# Patient Record
Sex: Female | Born: 1960 | Race: White | Hispanic: No | Marital: Married | Smoking: Former smoker
Health system: Southern US, Community
[De-identification: ages and names within clinical notes are randomized; demographics above are authoritative.]

## PROBLEM LIST (undated history)

## (undated) DIAGNOSIS — I10 Essential (primary) hypertension: Secondary | ICD-10-CM

## (undated) HISTORY — PX: EYE SURGERY: SHX253

## (undated) HISTORY — DX: Essential (primary) hypertension: I10

---

## 2002-06-01 ENCOUNTER — Other Ambulatory Visit: Admission: RE | Admit: 2002-06-01 | Discharge: 2002-06-01 | Payer: Self-pay | Admitting: Unknown Physician Specialty

## 2017-12-19 ENCOUNTER — Emergency Department (HOSPITAL_COMMUNITY)
Admission: EM | Admit: 2017-12-19 | Discharge: 2017-12-19 | Disposition: A | Discharge status: Home / Self Care | Payer: Managed Care, Other (non HMO) | Attending: Emergency Medicine | Admitting: Emergency Medicine

## 2017-12-19 ENCOUNTER — Emergency Department (HOSPITAL_COMMUNITY): Payer: Managed Care, Other (non HMO)

## 2017-12-19 ENCOUNTER — Encounter (HOSPITAL_COMMUNITY): Payer: Self-pay

## 2017-12-19 ENCOUNTER — Other Ambulatory Visit: Payer: Self-pay

## 2017-12-19 DIAGNOSIS — Z79899 Other long term (current) drug therapy: Secondary | ICD-10-CM | POA: Diagnosis not present

## 2017-12-19 DIAGNOSIS — I10 Essential (primary) hypertension: Secondary | ICD-10-CM | POA: Diagnosis not present

## 2017-12-19 DIAGNOSIS — Z87891 Personal history of nicotine dependence: Secondary | ICD-10-CM | POA: Diagnosis not present

## 2017-12-19 DIAGNOSIS — R0789 Other chest pain: Secondary | ICD-10-CM | POA: Insufficient documentation

## 2017-12-19 DIAGNOSIS — R079 Chest pain, unspecified: Secondary | ICD-10-CM

## 2017-12-19 LAB — BASIC METABOLIC PANEL
Anion gap: 9 (ref 5–15)
BUN: 10 mg/dL (ref 6–20)
CALCIUM: 9.5 mg/dL (ref 8.9–10.3)
CO2: 26 mmol/L (ref 22–32)
Chloride: 107 mmol/L (ref 101–111)
Creatinine, Ser: 0.85 mg/dL (ref 0.44–1.00)
GFR calc Af Amer: >60 mL/min (ref >60)
Glucose, Bld: 106 mg/dL — ABNORMAL HIGH (ref 65–99)
Potassium: 4 mmol/L (ref 3.5–5.1)
SODIUM: 142 mmol/L (ref 135–145)

## 2017-12-19 LAB — CBC
HCT: 43.8 % (ref 36.0–46.0)
Hemoglobin: 14.6 g/dL (ref 12.0–15.0)
MCH: 29.1 pg (ref 26.0–34.0)
MCHC: 33.3 g/dL (ref 30.0–36.0)
MCV: 87.3 fL (ref 78.0–100.0)
Platelets: 288 10*3/uL (ref 150–400)
RBC: 5.02 MIL/uL (ref 3.87–5.11)
RDW: 12.3 % (ref 11.5–15.5)
WBC: 6.8 10*3/uL (ref 4.0–10.5)

## 2017-12-19 LAB — I-STAT TROPONIN, ED
Troponin i, poc: 0 ng/mL (ref 0.00–0.08)
Troponin i, poc: 0.07 ng/mL (ref 0.00–0.08)

## 2017-12-19 LAB — I-STAT BETA HCG BLOOD, ED (MC, WL, AP ONLY)

## 2017-12-19 MED ORDER — PANTOPRAZOLE SODIUM 20 MG PO TBEC: 20.0000 mg | DELAYED_RELEASE_TABLET | Freq: Every day | ORAL | 0 refills | Status: DC

## 2017-12-19 NOTE — ED Provider Notes (Signed)
[facility] EMERGENCY DEPARTMENT Provider Note   CSN: [id number] Arrival date & time: 12/19/17  1332     History   Chief Complaint Chief Complaint  Patient presents with  . Chest Pain    HPI Zane Samson Pinedo is a 57 y.o. female.  Patient is a 57 year old female who presents with chest pain.  She has no known past medical history.  She states she has been under a lot of stress recently taking care of her elderly parents.  She states over the last 1-2 weeks she has had a discomfort in the center of her chest.  She describes as a squeezing pain.  It does seem to be somewhat related to eating but is not always related to eating.  There is no radiation of the pain.  No associated shortness of breath, nausea or diaphoresis.  No history of cardiac events in the past.  There is no exertional symptoms.  She has no family history of early heart disease.  She is a non-smoker.  No known history of hypertension or hyperlipidemia although her husband states that her blood pressure has been a little bit higher this week than it normally is.  She is noted to have an elevated blood pressure here in the emergency room but does appear to be anxious.  She was initially seen in an urgent care in [facility] and sent here for further evaluation due to an abnormal EKG.  She currently denies any chest pain.      History reviewed. No pertinent past medical history.  There are no active problems to display for this patient.   Past Surgical History:  Procedure Laterality Date  . EYE SURGERY      OB History    No data available       Home Medications    Prior to Admission medications   Medication Sig Start Date End Date Taking? Authorizing Provider  doxycycline (VIBRAMYCIN) 100 MG capsule Take 100 mg by mouth 2 (two) times daily. 12/06/17  Yes [provider]  gentamicin ointment (GARAMYCIN) 0.1 % Apply 1 application topically 2 (two) times daily. 12/07/17  Yes  [provider]  ibuprofen (ADVIL,MOTRIN) 800 MG tablet Take 800 mg by mouth every 8 (eight) hours as needed for moderate pain.   Yes [provider]  pantoprazole (PROTONIX) 20 MG tablet Take 1 tablet (20 mg total) by mouth daily. 12/19/17   Rolan Bucco, MD    Family History No family history on file.  Social History Social History   Tobacco Use  . Smoking status: Former Games developer  . Smokeless tobacco: Never Used  Substance Use Topics  . Alcohol use: Yes    Comment: Occasionally  . Drug use: No     Allergies   Asa [aspirin]; Fish allergy; Niacin and related; and Omega 3 [fish oil]   Review of Systems Review of Systems  Constitutional: Negative for chills, diaphoresis, fatigue and fever.  HENT: Negative for congestion, rhinorrhea and sneezing.   Eyes: Negative.   Respiratory: Positive for chest tightness. Negative for cough and shortness of breath.   Cardiovascular: Negative for chest pain and leg swelling.  Gastrointestinal: Negative for abdominal pain, blood in stool, diarrhea, nausea and vomiting.  Genitourinary: Negative for difficulty urinating, flank pain, frequency and hematuria.  Musculoskeletal: Negative for arthralgias and back pain.  Skin: Negative for rash.  Neurological: Negative for dizziness, speech difficulty, weakness, numbness and headaches.     Physical Exam Updated Vital Signs BP Marland Kitchen)  179/87   Pulse 66   Temp 98.2 F (36.8 C) (Oral)   Resp (!) 21   Ht 5\' 6"  (1.676 m)   Wt 88 kg (194 lb)   SpO2 96%   BMI 31.31 kg/m   Physical Exam  Constitutional: She is oriented to person, place, and time. She appears well-developed and well-nourished.  HENT:  Head: Normocephalic and atraumatic.  Eyes: Pupils are equal, round, and reactive to light.  Neck: Normal range of motion. Neck supple.  Cardiovascular: Normal rate, regular rhythm and normal heart sounds.  Pulmonary/Chest: Effort normal and breath sounds normal. No respiratory  distress. She has no wheezes. She has no rales. She exhibits no tenderness.  Abdominal: Soft. Bowel sounds are normal. There is no tenderness. There is no rebound and no guarding.  Musculoskeletal: Normal range of motion. She exhibits no edema.  No edema or calf tenderness  Lymphadenopathy:    She has no cervical adenopathy.  Neurological: She is alert and oriented to person, place, and time.  Skin: Skin is warm and dry. No rash noted.  Psychiatric: She has a normal mood and affect.     ED Treatments / Results  Labs (all labs ordered are listed, but only abnormal results are displayed) Labs Reviewed  BASIC METABOLIC PANEL - Abnormal; Notable for the following components:      Result Value   Glucose, Bld 106 (*)    All other components within normal limits  CBC  I-STAT TROPONIN, ED  I-STAT BETA HCG BLOOD, ED ([facility], [facility], [facility] ONLY)  I-STAT TROPONIN, ED    EKG  EKG Interpretation  Date/Time:  Thursday December 19 2017 21:04:03 EST Ventricular Rate:  71 PR Interval:  162 QRS Duration: 99 QT Interval:  425 QTC Calculation: 462 R Axis:   60 Text Interpretation:  Sinus rhythm since last tracing no significant change Confirmed by Rolan BuccoBelfi, Ekin Pilar [phone]) on 12/19/2017 9:07:06 PM       Radiology Dg Chest 2 View  Result Date: 12/19/2017 CLINICAL DATA:  Chest tightness and dizziness for 3 days. EXAM: CHEST  2 VIEW COMPARISON:  None. FINDINGS: The lungs are clear. Heart size is upper normal. No pneumothorax or pleural effusion. No bony abnormality. IMPRESSION: No acute disease. Electronically Signed   By: Drusilla Kannerhomas  Dalessio M.D.   On: 12/19/2017 14:58    Procedures Procedures (including critical care time)  Medications Ordered in ED Medications - No data to display   Initial Impression / Assessment and Plan / ED Course  I have reviewed the triage vital signs and the nursing notes.  Pertinent labs & imaging results that were available during my care of the patient were reviewed by me  and considered in my medical decision making (see chart for details).     Patient presents with chest pain.  She describes it is a tightness in the center of her chest.  It is nonradiating.  There is no exertional symptoms.  There is no ischemic changes noted on EKG.  Her only abnormality is a T wave inversion in lead II.  I reviewed the EKG from the urgent care which showed a Q wave and inverted T wave in lead II but no other abnormalities.  She has had 2- troponins.  She is remained chest pain-free.  She has had a repeat EKG which is also is unchanged.  She has a heart score of 2.  Given this, I feel that she can be discharged home with close follow-up with her PCP.  I will start her on Protonix as it sounds like it may be GERD related.  I did encourage her to have close follow-up with her PCP tomorrow.  There is also a cardiologist in her area from [facility].  It was discussed that the patient can follow-up with her PCP tomorrow and either have a stress test arranged by them or have close follow-up with a cardiologist.  Return precautions were given.  Final Clinical Impressions(s) / ED Diagnoses   Final diagnoses:  Nonspecific chest pain    ED Discharge Orders        Ordered    pantoprazole (PROTONIX) 20 MG tablet  Daily     12/19/17 2107       Rolan Bucco, MD 12/19/17 2117

## 2017-12-19 NOTE — ED Triage Notes (Signed)
Per Pt, Pt is coming from UC where she was evaluated for "not feeling well" including chest tightness, weakness, lightheadedness.  HTN. Denies SOB, N/V/D. They reported some abnormal EKG changes.

## 2017-12-19 NOTE — ED Notes (Signed)
Patient verbalizes understanding of discharge instructions. Opportunity for questioning and answers were provided. Armband removed by staff, pt discharged from ED ambulatory.   

## 2018-01-07 ENCOUNTER — Encounter: Payer: Self-pay | Admitting: *Deleted

## 2018-01-07 ENCOUNTER — Encounter: Payer: Self-pay | Admitting: Cardiology

## 2018-01-07 NOTE — Progress Notes (Signed)
Cardiology Office Note  Date: 01/08/2018   ID: Susan Frank, DOB 1961-01-31, MRN [id number]  PCP: Estanislado Pandy, MD  Consulting Cardiologist: Nona Dell, MD   Chief Complaint  Patient presents with  . Chest Pain    History of Present Illness: Susan Frank is a 57 y.o. female referred for cardiology consultation by Ms. Nechama Guard for the assessment of chest pain. Record review finds ER visit on January 3 with chest discomfort, ECG nonspecific with poor R-wave progression, troponin I negative. She was started on Protonix for possibility of reflux. She states that that was the most intense, sharp episode that she has experienced, occurred at rest and scared her. She states that she has been under a lot of emotional stress. 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 She also indicates that her blood pressure has been fluctuating, not optimally controlled. She had her lisinopril dose increased recently by PCP. Since being on Protonix, she has had improvement in chest pain, only a few brief fleeting episodes.  Cardiac risk factors include hypertension, also she has a family history of diabetes mellitus and heart disease in first-degree relatives. She reports compliance with her medications which are outlined below.  She has not undergone any previous ischemic cardiac testing.  Past Medical History:  Diagnosis Date  . Essential hypertension     Past Surgical History:  Procedure Laterality Date  . EYE SURGERY      Current Outpatient Medications  Medication Sig Dispense Refill  . ibuprofen (ADVIL,MOTRIN) 800 MG tablet Take 800 mg by mouth every 8 (eight) hours as needed for moderate pain.    Marland Kitchen lisinopril (PRINIVIL,ZESTRIL) 10 MG tablet Take 20 mg by mouth daily.     . pantoprazole (PROTONIX) 20 MG tablet Take 1 tablet (20 mg total) by mouth daily. 30 tablet 0   No current  facility-administered medications for this visit.    Allergies:  Asa [aspirin]; Zoloft [sertraline hcl]; Fish allergy; Niacin and related; and Omega 3 [fish oil]   Social History: The patient  reports that she has quit smoking. she has never used smokeless tobacco. She reports that she drinks alcohol. She reports that she does not use drugs.   Family History: The patient's family history includes Diabetes in her brother, brother, and father; Heart disease in her brother, father, and mother; Stroke in her father.   ROS:  Please see the history of present illness. Otherwise, complete review of systems is positive for situational stress.  All other systems are reviewed and negative.   Physical Exam: VS:  BP 140/82   Pulse 68   Ht 5\' 6"  (1.676 m)   Wt 197 lb (89.4 kg)   SpO2 97%   BMI 31.80 kg/m , BMI Body mass index is 31.8 kg/m.  Wt Readings from Last 3 Encounters:  01/08/18 197 lb (89.4 kg)  12/24/17 197 lb (89.4 kg)  12/19/17 194 lb (88 kg)    General: Patient appears comfortable at rest. HEENT: Conjunctiva and lids normal, oropharynx clear. Neck: Supple, no elevated JVP or carotid bruits, no thyromegaly. Lungs: Clear to auscultation, nonlabored breathing at rest. Cardiac: Regular rate and rhythm, no S3 or significant systolic murmur, no pericardial rub. Abdomen: Soft, nontender, bowel sounds present, no guarding or rebound. Extremities: No pitting edema, distal pulses 2+. Skin: Warm and dry. Musculoskeletal: No kyphosis. Neuropsychiatric: Alert and oriented x3, affect grossly  appropriate.  ECG: I personally reviewed the tracing from 12/19/2017 which showed normal sinus rhythm with decreased R wave progression.  Recent Labwork: 12/19/2017: BUN 10; Creatinine, Ser 0.85; Hemoglobin 14.6; Platelets 288; Potassium 4.0; Sodium 142   Other Studies Reviewed Today:  Chest x-ray 12/19/2017: FINDINGS: The lungs are clear. Heart size is upper normal. No pneumothorax or pleural effusion.  No bony abnormality.  IMPRESSION: No acute disease.  Assessment and Plan:  1. Atypical chest pain as discussed above. Possibility of reflux symptoms is being treated, but she has also had uncontrolled blood pressure recently. Baseline ECG is relatively nonspecific but shows poor R-wave progression. She has not undergone prior ischemic cardiac testing. We will arrange an exercise echocardiogram for surveillance.  2. Essential hypertension, blood pressure recently not well controlled. We discussed sodium restriction. Lisinopril was just increased to 20 mg daily. This could be changed to a combination agent with HCTZ as well as the next step. Keep follow-up with PCP.  3. Tobacco abuse in remission.  Current medicines were reviewed with the patient today.   Orders Placed This Encounter  Procedures  . ECHOCARDIOGRAM STRESS TEST    Disposition: Call with test results.  Signed, Jonelle SidleSamuel G. Najmo Pardue, MD, [facility] Center[facility]ACC 01/08/2018 9:15 AM    [facility] [address], [place], [place] [address] Phone: [phone]) [phone]; Fax: [phone]

## 2018-01-08 ENCOUNTER — Encounter: Payer: Self-pay | Admitting: Cardiology

## 2018-01-08 ENCOUNTER — Ambulatory Visit: Payer: Managed Care, Other (non HMO) | Admitting: Cardiology

## 2018-01-08 VITALS — BP 140/82 | HR 68 | Ht 66.0 in | Wt 197.0 lb

## 2018-01-08 DIAGNOSIS — R9431 Abnormal electrocardiogram [ECG] [EKG]: Secondary | ICD-10-CM | POA: Diagnosis not present

## 2018-01-08 DIAGNOSIS — R0789 Other chest pain: Secondary | ICD-10-CM | POA: Diagnosis not present

## 2018-01-08 DIAGNOSIS — F17201 Nicotine dependence, unspecified, in remission: Secondary | ICD-10-CM

## 2018-01-08 DIAGNOSIS — I10 Essential (primary) hypertension: Secondary | ICD-10-CM | POA: Diagnosis not present

## 2018-01-08 NOTE — Patient Instructions (Signed)
Medication Instructions:  Your physician recommends that you continue on your current medications as directed. Please refer to the Current Medication list given to you today.  Labwork: NONE  Testing/Procedures: Your physician has requested that you have a stress echocardiogram. For further information please visit [email or web address]. Please follow instruction sheet as given.  Follow-Up: Your physician recommends that you schedule a follow-up appointment PENDING TEST RESULTS   Any Other Special Instructions Will Be Listed Below (If Applicable).  If you need a refill on your cardiac medications before your next appointment, please call your pharmacy. 

## 2018-01-21 ENCOUNTER — Ambulatory Visit (HOSPITAL_COMMUNITY)
Admission: RE | Admit: 2018-01-21 | Discharge: 2018-01-21 | Disposition: A | Discharge status: Home / Self Care | Payer: Managed Care, Other (non HMO) | Source: Ambulatory Visit | Attending: Cardiology | Admitting: Cardiology

## 2018-01-21 DIAGNOSIS — R9431 Abnormal electrocardiogram [ECG] [EKG]: Secondary | ICD-10-CM | POA: Insufficient documentation

## 2018-01-21 DIAGNOSIS — R0789 Other chest pain: Secondary | ICD-10-CM | POA: Diagnosis not present

## 2018-01-21 LAB — ECHOCARDIOGRAM STRESS TEST
CHL CUP MPHR: 165 {beats}/min
CSEPEW: 10 METS
CSEPPHR: 166 {beats}/min
Exercise duration (min): 7 min
Exercise duration (sec): 14 s
Percent HR: 100 %
RPE: 13
Rest HR: 65 {beats}/min

## 2018-01-21 NOTE — Progress Notes (Signed)
*  PRELIMINARY RESULTS* Echocardiogram Echocardiogram Stress Test has been performed.  Stacey DrainWhite, Mariluz Crespo J 01/21/2018, 12:44 PM

## 2018-01-22 ENCOUNTER — Telehealth: Payer: Self-pay

## 2018-01-22 NOTE — Telephone Encounter (Signed)
Patient notified. Routed to PCP 

## 2018-01-22 NOTE — Telephone Encounter (Signed)
-----   Message from Eustace MooreLydia M Anderson, LPN sent at 1/6/10962/05/2018  7:45 AM EST -----   ----- Message ----- From: Jonelle SidleMcDowell, Samuel G, MD Sent: 01/21/2018   4:21 PM To: Eustace MooreLydia M Anderson, LPN  Results reviewed.  Overall normal exercise echocardiogram, reassuring.  No further cardiac workup anticipated at this time.  Keep follow-up with PCP. A copy of this test should be forwarded to Estanislado PandySasser, Paul W, MD.

## 2020-12-17 DIAGNOSIS — F419 Anxiety disorder, unspecified: Secondary | ICD-10-CM

## 2020-12-17 HISTORY — DX: Anxiety disorder, unspecified: F41.9

## 2021-05-02 ENCOUNTER — Emergency Department (HOSPITAL_COMMUNITY): Payer: BC Managed Care – PPO

## 2021-05-02 ENCOUNTER — Other Ambulatory Visit: Payer: Self-pay

## 2021-05-02 ENCOUNTER — Observation Stay (HOSPITAL_COMMUNITY)
Admission: EM | Admit: 2021-05-02 | Discharge: 2021-05-03 | Disposition: A | Discharge status: Home / Self Care | Payer: BC Managed Care – PPO | Attending: Internal Medicine | Admitting: Internal Medicine

## 2021-05-02 ENCOUNTER — Encounter (HOSPITAL_COMMUNITY): Payer: Self-pay

## 2021-05-02 DIAGNOSIS — R1013 Epigastric pain: Secondary | ICD-10-CM | POA: Diagnosis present

## 2021-05-02 DIAGNOSIS — I10 Essential (primary) hypertension: Secondary | ICD-10-CM | POA: Diagnosis not present

## 2021-05-02 DIAGNOSIS — K317 Polyp of stomach and duodenum: Secondary | ICD-10-CM | POA: Insufficient documentation

## 2021-05-02 DIAGNOSIS — Z20822 Contact with and (suspected) exposure to covid-19: Secondary | ICD-10-CM | POA: Diagnosis not present

## 2021-05-02 DIAGNOSIS — K2900 Acute gastritis without bleeding: Secondary | ICD-10-CM | POA: Diagnosis not present

## 2021-05-02 DIAGNOSIS — K2950 Unspecified chronic gastritis without bleeding: Principal | ICD-10-CM | POA: Insufficient documentation

## 2021-05-02 DIAGNOSIS — R7989 Other specified abnormal findings of blood chemistry: Secondary | ICD-10-CM | POA: Diagnosis not present

## 2021-05-02 DIAGNOSIS — K3189 Other diseases of stomach and duodenum: Secondary | ICD-10-CM | POA: Diagnosis not present

## 2021-05-02 DIAGNOSIS — R7401 Elevation of levels of liver transaminase levels: Secondary | ICD-10-CM | POA: Insufficient documentation

## 2021-05-02 DIAGNOSIS — Z79899 Other long term (current) drug therapy: Secondary | ICD-10-CM | POA: Insufficient documentation

## 2021-05-02 LAB — CBC
HCT: 41.6 % (ref 36.0–46.0)
Hemoglobin: 13.3 g/dL (ref 12.0–15.0)
MCH: 27.9 pg (ref 26.0–34.0)
MCHC: 32 g/dL (ref 30.0–36.0)
MCV: 87.4 fL (ref 80.0–100.0)
Platelets: 293 10*3/uL (ref 150–400)
RBC: 4.76 MIL/uL (ref 3.87–5.11)
RDW: 11.7 % (ref 11.5–15.5)
WBC: 10.5 10*3/uL (ref 4.0–10.5)
nRBC: 0 % (ref 0.0–0.2)

## 2021-05-02 LAB — TROPONIN I (HIGH SENSITIVITY)
Troponin I (High Sensitivity): 4 ng/L (ref <18)
Troponin I (High Sensitivity): 5 ng/L (ref <18)

## 2021-05-02 LAB — BASIC METABOLIC PANEL
Anion gap: 9 (ref 5–15)
BUN: 20 mg/dL (ref 6–20)
CO2: 26 mmol/L (ref 22–32)
Calcium: 9.5 mg/dL (ref 8.9–10.3)
Chloride: 103 mmol/L (ref 98–111)
Creatinine, Ser: 0.98 mg/dL (ref 0.44–1.00)
GFR, Estimated: >60 mL/min (ref >60)
Glucose, Bld: 109 mg/dL — ABNORMAL HIGH (ref 70–99)
Potassium: 3.8 mmol/L (ref 3.5–5.1)
Sodium: 138 mmol/L (ref 135–145)

## 2021-05-02 LAB — HEPATIC FUNCTION PANEL
ALT: 260 U/L — ABNORMAL HIGH (ref 0–44)
AST: 348 U/L — ABNORMAL HIGH (ref 15–41)
Albumin: 4.1 g/dL (ref 3.5–5.0)
Alkaline Phosphatase: 130 U/L — ABNORMAL HIGH (ref 38–126)
Bilirubin, Direct: 0.9 mg/dL — ABNORMAL HIGH (ref 0.0–0.2)
Indirect Bilirubin: 1.1 mg/dL — ABNORMAL HIGH (ref 0.3–0.9)
Total Bilirubin: 2 mg/dL — ABNORMAL HIGH (ref 0.3–1.2)
Total Protein: 7.4 g/dL (ref 6.5–8.1)

## 2021-05-02 LAB — LIPASE, BLOOD: Lipase: 50 U/L (ref 11–51)

## 2021-05-02 MED ORDER — ONDANSETRON HCL 4 MG/2ML IJ SOLN
4.0000 mg | Freq: Once | INTRAMUSCULAR | Status: DC | PRN
Start: 2021-05-02 — End: 2021-05-02

## 2021-05-02 MED ORDER — SODIUM CHLORIDE 0.9 % IV SOLN: INTRAVENOUS | Status: DC

## 2021-05-02 MED ORDER — SODIUM CHLORIDE 0.9 % IV BOLUS: 1000.0000 mL | Freq: Once | INTRAVENOUS | Status: DC

## 2021-05-02 MED ORDER — ONDANSETRON HCL 4 MG PO TABS: 4.0000 mg | ORAL_TABLET | Freq: Four times a day (QID) | ORAL | Status: DC | PRN

## 2021-05-02 MED ORDER — LISINOPRIL 20 MG PO TABS: 20.0000 mg | ORAL_TABLET | Freq: Every day | ORAL | Status: DC

## 2021-05-02 MED ORDER — MONTELUKAST SODIUM 10 MG PO TABS
10.0000 mg | ORAL_TABLET | Freq: Every day | ORAL | Status: DC | PRN
  Filled 2021-05-02: qty 1

## 2021-05-02 MED ORDER — FENTANYL CITRATE (PF) 100 MCG/2ML IJ SOLN: 25.0000 ug | INTRAMUSCULAR | Status: DC | PRN

## 2021-05-02 MED ORDER — LISINOPRIL 10 MG PO TABS
10.0000 mg | ORAL_TABLET | Freq: Every day | ORAL | Status: DC
  Administered 2021-05-02 – 2021-05-03 (×2): 10 mg via ORAL
  Filled 2021-05-02 (×2): qty 1

## 2021-05-02 MED ORDER — ONDANSETRON HCL 4 MG/2ML IJ SOLN: 4.0000 mg | Freq: Four times a day (QID) | INTRAMUSCULAR | Status: DC | PRN

## 2021-05-02 MED ORDER — ESCITALOPRAM OXALATE 10 MG PO TABS
10.0000 mg | ORAL_TABLET | Freq: Every day | ORAL | Status: DC
  Administered 2021-05-02: 10 mg via ORAL
  Filled 2021-05-02: qty 1

## 2021-05-02 NOTE — H&P (Signed)
History and Physical    Brigit Doke Fantroy ZOX:[id number] DOB: 30-Apr-1961 DOA: 05/02/2021  PCP: Manon Hilding, MD   Patient coming from: Home   Chief Complaint: Epigastric pain, N/V   HPI: Susan Frank is a 60 y.o. female with medical history significant for hypertension, now presenting to the emergency department for evaluation of epigastric pain, nausea, and vomiting.  Patient reports that she went to bed in her usual state but woke at 530 this morning with severe pain in the lower chest/upper abdomen.  There was nausea associated with this and she vomited.  Symptoms began to resolve spontaneously before returning again.  She has had mild persisting pain in the epigastrium but has had several episodes now of more severe pain with nausea, vomiting, and diaphoresis lasting close to 30 minutes.  She denies any fevers or chills.  She has had chest pain a few years ago with reassuring cardiac work-up per her report, but never symptoms like this.    ED Course: Upon arrival to the ED, patient is found to be afebrile, saturating well on room air, and with stable blood pressure.  EKG features sinus rhythm and chest x-ray negative for acute cardiopulmonary disease.  Right upper quadrant ultrasound notable for small gallbladder polyp without any other focal abnormality.  Chemistry panel features alkaline phosphatase 130, AST 348, ALT 260, and total bilirubin 2.0.  CBC is unremarkable.  High-sensitivity troponin and BNP are normal.  GI was consulted by the ED physician and recommended MRCP.  Review of Systems:  All other systems reviewed and apart from HPI, are negative.  Past Medical History:  Diagnosis Date  . Essential hypertension     Past Surgical History:  Procedure Laterality Date  . EYE SURGERY      Social History:   reports that she has quit smoking. She has never used smokeless tobacco. She reports current alcohol use. She reports that she does not use drugs.  Allergies  Allergen  Reactions  . Asa [Aspirin]     Shortness ob breath   . Zoloft [Sertraline Hcl]     angioedema   . Fish Allergy Rash  . Niacin And Related Palpitations  . Omega 3 [Fish Oil] Nausea And Vomiting and Rash    Family History  Problem Relation Age of Onset  . Diabetes Father   . Stroke Father   . Heart disease Father   . Heart disease Mother   . Diabetes Brother   . Heart disease Brother   . Diabetes Brother      Prior to Admission medications   Medication Sig Start Date End Date Taking? Authorizing Provider  escitalopram (LEXAPRO) 10 MG tablet Take 10 mg by mouth at bedtime.   Yes [provider]  lisinopril-hydrochlorothiazide (ZESTORETIC) 20-12.5 MG tablet Take 1 tablet by mouth daily.   Yes [provider]  montelukast (SINGULAIR) 10 MG tablet Take 10 mg by mouth daily as needed (For allergy).   Yes [provider]  ibuprofen (ADVIL,MOTRIN) 800 MG tablet Take 800 mg by mouth every 8 (eight) hours as needed for moderate pain.    [provider]    Physical Exam: Vitals:   05/02/21 1730 05/02/21 1830 05/02/21 1930 05/02/21 2000  BP: (!) 159/79 (!) 157/76 (!) 152/76 (!) 166/78  Pulse: (!) 51 (!) 54 (!) 53 (!) 56  Resp: _0 Temp:      TempSrc:      SpO2: 98% 100% 98% 97%  Constitutional: NAD, calm  Eyes: PERTLA, lids and conjunctivae normal ENMT: Mucous membranes are moist. Posterior pharynx clear of any exudate or lesions.   Neck: supple, no masses  Respiratory: no wheezing, no crackles. No accessory muscle use.  Cardiovascular: S1 & S2 heard, regular rate and rhythm. No extremity edema.  Abdomen: No distension, soft, tender in epigastrium without rebound pain or guarding. Bowel sounds active.  Musculoskeletal: no clubbing / cyanosis. No joint deformity upper and lower extremities.   Skin: no significant rashes, lesions, ulcers. Warm, dry, well-perfused. Neurologic: CN 2-12 grossly intact. Sensation intact. Moving all  extremities.  Psychiatric: Alert and oriented to person, place, and situation. Pleasant and cooperative.    Labs and Imaging on Admission: I have personally reviewed following labs and imaging studies  CBC: Recent Labs  Lab 05/02/21 1122  WBC 10.5  HGB 13.3  HCT 41.6  MCV 87.4  PLT 268   Basic Metabolic Panel: Recent Labs  Lab 05/02/21 1122  NA 138  K 3.8  CL 103  CO2 26  GLUCOSE 109*  BUN 20  CREATININE 0.98  CALCIUM 9.5   GFR: CrCl cannot be calculated (Unknown ideal weight.). Liver Function Tests: Recent Labs  Lab 05/02/21 1623  AST 348*  ALT 260*  ALKPHOS 130*  BILITOT 2.0*  PROT 7.4  ALBUMIN 4.1   Recent Labs  Lab 05/02/21 1623  LIPASE 50   No results for input(s): AMMONIA in the last 168 hours. Coagulation Profile: No results for input(s): INR, PROTIME in the last 168 hours. Cardiac Enzymes: No results for input(s): CKTOTAL, CKMB, CKMBINDEX, TROPONINI in the last 168 hours. BNP (last 3 results) No results for input(s): PROBNP in the last 8760 hours. HbA1C: No results for input(s): HGBA1C in the last 72 hours. CBG: No results for input(s): GLUCAP in the last 168 hours. Lipid Profile: No results for input(s): CHOL, HDL, LDLCALC, TRIG, CHOLHDL, LDLDIRECT in the last 72 hours. Thyroid Function Tests: No results for input(s): TSH, T4TOTAL, FREET4, T3FREE, THYROIDAB in the last 72 hours. Anemia Panel: No results for input(s): VITAMINB12, FOLATE, FERRITIN, TIBC, IRON, RETICCTPCT in the last 72 hours. Urine analysis: No results found for: COLORURINE, APPEARANCEUR, LABSPEC, PHURINE, GLUCOSEU, HGBUR, BILIRUBINUR, KETONESUR, PROTEINUR, UROBILINOGEN, NITRITE, LEUKOCYTESUR Sepsis Labs: _0 (procalcitonin:4,lacticidven:4) )No results found for this or any previous visit (from the past 240 hour(s)).   Radiological Exams on Admission: DG Chest 2 View  Result Date: 05/02/2021 CLINICAL DATA:  Chest pain. EXAM: CHEST - 2 VIEW COMPARISON:  Chest x-ray  dated December 19, 2017. FINDINGS: The heart size and mediastinal contours are within normal limits. Both lungs are clear. The visualized skeletal structures are unremarkable. IMPRESSION: No active cardiopulmonary disease. Electronically Signed   By: Titus Dubin M.D.   On: 05/02/2021 12:23   US Abdomen Limited RUQ (LIVER/GB)  Result Date: 05/02/2021 CLINICAL DATA:  Epigastric pain EXAM: ULTRASOUND ABDOMEN LIMITED RIGHT UPPER QUADRANT COMPARISON:  None. FINDINGS: Gallbladder: Gallbladder is well distended. No wall thickening or pericholecystic fluid is noted. 6 mm polyp is seen. No cholelithiasis is noted. Common bile duct: Diameter: 3 mm Liver: No focal lesion identified. Within normal limits in parenchymal echogenicity. Portal vein is patent on color Doppler imaging with normal direction of blood flow towards the liver. Other: None. IMPRESSION: Small gallbladder polyp measuring 6 mm. No other focal abnormality is noted. Electronically Signed   By: Inez Catalina M.D.   On: 05/02/2021 18:55    EKG: Independently reviewed. Sinus rhythm.   Assessment/Plan   1.  Epigastric pain, elevated LFTs  - Presents with one day of epigastric pain with N/V and found to have alk phos 130, AST 348, ALT 260, and t bili 2.0 with no stones or biliary dilation on RUQ [place]  - Lipase is normal and no infectious signs/symptoms  - GI consulted by ED physician and recommended MRCP  - Continue symptom-control, check MRCP, repeat LFTs in am   2. Hypertension  - Continue lisinopril    3. Depression, anxiety  - Continue Lexapro    DVT prophylaxis: SCDs Code Status: Full  Level of Care: Level of care: Med-Surg Family Communication: Husband updated at bedside Disposition Plan:  Patient is from: Home  Anticipated d/c is to: Home  Anticipated d/c date is: Possibly as early as 05/03/21 Patient currently: Pending MRCP, GI consultation  Consults called: GI  Admission status: Observation     Vianne Bulls, MD [facility]  05/02/2021, 10:22 PM

## 2021-05-02 NOTE — ED Triage Notes (Signed)
Pt reports waking at 0530 this am with mid-sternum cp, N/V

## 2021-05-02 NOTE — ED Notes (Signed)
Called lab to add on liver fx and lipase to previous lab draw. Lab states it is in process.

## 2021-05-02 NOTE — ED Provider Notes (Signed)
[facility] EMERGENCY DEPARTMENT Provider Note   CSN: [id number] Arrival date & time: 05/02/21  1040     History Chief Complaint  Patient presents with  . Chest Pain    Susan Frank is a 60 y.o. female History of hypertension, presenting for evaluation of chest and abdominal pain that began this morning.  Patient states she woke up around 5:30 AM with lower substernal/epigastric pain.  She describes the pain as feeling like a muscle cramp that she would get in her leg.  She then had associated nausea and vomiting.  While she was vomiting she had diaphoresis.  She had about 3 episodes of this.  Had similar like episodes while in the waiting room, lasting as long as 30 minutes.  Pain radiates towards her back.  Not necessarily postprandial.  She had pizza last night for dinner.  States she had very similar symptoms a few years ago where she was evaluated in the ED.  She had echo and stress test done outpatient following that and states she "passed with flying colors."   No shortness of breath, leg swelling, palpitations.  Pain did not radiate to her arm, neck, jaw.   Denies alcohol use. No cardiac hx or 1st deg relatives with early heart disease. Reports remote history of tobacco use, about 3 to 4 years in duration.  The history is provided by the patient and the spouse.       Past Medical History:  Diagnosis Date  . Essential hypertension     There are no problems to display for this patient.   Past Surgical History:  Procedure Laterality Date  . EYE SURGERY       OB History   No obstetric history on file.     Family History  Problem Relation Age of Onset  . Diabetes Father   . Stroke Father   . Heart disease Father   . Heart disease Mother   . Diabetes Brother   . Heart disease Brother   . Diabetes Brother     Social History   Tobacco Use  . Smoking status: Former Games developer  . Smokeless tobacco: Never Used  Substance Use Topics  .  Alcohol use: Yes    Comment: Occasionally  . Drug use: No    Home Medications Prior to Admission medications   Medication Sig Start Date End Date Taking? Authorizing Provider  ibuprofen (ADVIL,MOTRIN) 800 MG tablet Take 800 mg by mouth every 8 (eight) hours as needed for moderate pain.    [provider]  lisinopril (PRINIVIL,ZESTRIL) 10 MG tablet Take 20 mg by mouth daily.     [provider]  pantoprazole (PROTONIX) 20 MG tablet Take 1 tablet (20 mg total) by mouth daily. 12/19/17   Rolan Bucco, MD    Allergies    Asa [aspirin], Zoloft [sertraline hcl], Fish allergy, Niacin and related, and Omega 3 [fish oil]  Review of Systems   Review of Systems  Constitutional: Positive for diaphoresis.  Respiratory: Negative for shortness of breath.   Cardiovascular: Positive for chest pain.  Gastrointestinal: Positive for abdominal pain, nausea and vomiting.  All other systems reviewed and are negative.   Physical Exam Updated Vital Signs BP (!) 157/76   Pulse (!) 54   Temp 98.7 F (37.1 C) (Oral)   Resp 15   SpO2 100%   Physical Exam Vitals and nursing note reviewed.  Constitutional:      General: She is not in acute distress.  Appearance: She is well-developed. She is not ill-appearing.  HENT:     Head: Normocephalic and atraumatic.  Eyes:     Conjunctiva/sclera: Conjunctivae normal.  Cardiovascular:     Rate and Rhythm: Normal rate and regular rhythm.     Pulses: Normal pulses.     Heart sounds: Normal heart sounds.  Pulmonary:     Effort: Pulmonary effort is normal. No respiratory distress.     Breath sounds: Normal breath sounds.  Chest:     Chest wall: Tenderness present.    Abdominal:     General: Bowel sounds are normal.     Palpations: Abdomen is soft.     Tenderness: There is abdominal tenderness. There is no guarding or rebound.     Comments: Epigastric/lower sternal TTP to palpation. No skin changes  Musculoskeletal:     Right lower  leg: No edema.     Left lower leg: No edema.  Skin:    General: Skin is warm.  Neurological:     Mental Status: She is alert.  Psychiatric:        Behavior: Behavior normal.     ED Results / Procedures / Treatments   Labs (all labs ordered are listed, but only abnormal results are displayed) Labs Reviewed  BASIC METABOLIC PANEL - Abnormal; Notable for the following components:      Result Value   Glucose, Bld 109 (*)    All other components within normal limits  CBC  HEPATIC FUNCTION PANEL  LIPASE, BLOOD  TROPONIN I (HIGH SENSITIVITY)  TROPONIN I (HIGH SENSITIVITY)    EKG None  Radiology DG Chest 2 View  Result Date: 05/02/2021 CLINICAL DATA:  Chest pain. EXAM: CHEST - 2 VIEW COMPARISON:  Chest x-ray dated December 19, 2017. FINDINGS: The heart size and mediastinal contours are within normal limits. Both lungs are clear. The visualized skeletal structures are unremarkable. IMPRESSION: No active cardiopulmonary disease. Electronically Signed   By: Obie Dredge M.D.   On: 05/02/2021 12:23    Procedures Procedures   Medications Ordered in ED Medications  ondansetron (ZOFRAN) injection 4 mg (has no administration in time range)    ED Course  I have reviewed the triage vital signs and the nursing notes.  Pertinent labs & imaging results that were available during my care of the patient were reviewed by me and considered in my medical decision making (see chart for details).    MDM Rules/Calculators/A&P                          Patient is a 60 year old female with history of hypertension, presenting for lower dental/epigastric abdominal pain that began this morning.  Pain is coming in waves with associated nausea and vomiting.  Pain radiates to her back and lower in her abdomen.  Denies alcohol use.  No shortness of breath.  Does states she has diaphoresis when she vomits.    Pain is reproducible on palpation.  Chest x-ray is negative.  Heart rate is low-normal,  appears similar to prior ED visit.  EKG is normal sinus rhythm.  Troponins x2 are flat and negative.  Blood counts and metabolic panel are also unremarkable.  Added on LFTs and lipase, as well as right upper quadrant ultrasound.  Consider cholelithiasis versus PUD or gastritis (history of GERD).  Also consider nonalcoholic pancreatitis, though seems to have limited risk factors for this.  Consideration for ACS, symptoms appear atypical. Hear Score of 3  Care  handed off at shift change to Dr. Jodi Mourning.  Follow-up LFTs, lipase and ultrasound.    Final Clinical Impression(s) / ED Diagnoses Final diagnoses:  Epigastric abdominal pain    Rx / DC Orders ED Discharge Orders    None       Beatrice Sehgal, [place] N, PA-C 05/02/21 Herbie Baltimore    Blane Ohara, MD 05/03/21 [phone]

## 2021-05-02 NOTE — ED Notes (Addendum)
Pt is requesting ODT Zofran instead of IVP. Provider notified.

## 2021-05-03 ENCOUNTER — Encounter (HOSPITAL_COMMUNITY): Payer: Self-pay | Admitting: *Deleted

## 2021-05-03 ENCOUNTER — Encounter (HOSPITAL_COMMUNITY)
Admission: EM | Disposition: A | Discharge status: Home / Self Care | Payer: Self-pay | Source: Home / Self Care | Attending: Emergency Medicine

## 2021-05-03 ENCOUNTER — Observation Stay (HOSPITAL_COMMUNITY): Payer: BC Managed Care – PPO

## 2021-05-03 DIAGNOSIS — G8929 Other chronic pain: Secondary | ICD-10-CM

## 2021-05-03 DIAGNOSIS — R7989 Other specified abnormal findings of blood chemistry: Secondary | ICD-10-CM | POA: Diagnosis not present

## 2021-05-03 DIAGNOSIS — R1013 Epigastric pain: Secondary | ICD-10-CM | POA: Diagnosis not present

## 2021-05-03 DIAGNOSIS — K317 Polyp of stomach and duodenum: Secondary | ICD-10-CM | POA: Diagnosis not present

## 2021-05-03 HISTORY — PX: ESOPHAGOGASTRODUODENOSCOPY (EGD) WITH PROPOFOL: SHX5813

## 2021-05-03 HISTORY — PX: BIOPSY: SHX5522

## 2021-05-03 LAB — CBC
HCT: 38.5 % (ref 36.0–46.0)
Hemoglobin: 12.3 g/dL (ref 12.0–15.0)
MCH: 27.7 pg (ref 26.0–34.0)
MCHC: 31.9 g/dL (ref 30.0–36.0)
MCV: 86.7 fL (ref 80.0–100.0)
Platelets: 258 10*3/uL (ref 150–400)
RBC: 4.44 MIL/uL (ref 3.87–5.11)
RDW: 11.9 % (ref 11.5–15.5)
WBC: 5.5 10*3/uL (ref 4.0–10.5)
nRBC: 0 % (ref 0.0–0.2)

## 2021-05-03 LAB — COMPREHENSIVE METABOLIC PANEL
ALT: 229 U/L — ABNORMAL HIGH (ref 0–44)
AST: 184 U/L — ABNORMAL HIGH (ref 15–41)
Albumin: 3.4 g/dL — ABNORMAL LOW (ref 3.5–5.0)
Alkaline Phosphatase: 118 U/L (ref 38–126)
Anion gap: 8 (ref 5–15)
BUN: 14 mg/dL (ref 6–20)
CO2: 26 mmol/L (ref 22–32)
Calcium: 9.1 mg/dL (ref 8.9–10.3)
Chloride: 105 mmol/L (ref 98–111)
Creatinine, Ser: 0.97 mg/dL (ref 0.44–1.00)
GFR, Estimated: >60 mL/min (ref >60)
Glucose, Bld: 93 mg/dL (ref 70–99)
Potassium: 3.5 mmol/L (ref 3.5–5.1)
Sodium: 139 mmol/L (ref 135–145)
Total Bilirubin: 0.8 mg/dL (ref 0.3–1.2)
Total Protein: 6 g/dL — ABNORMAL LOW (ref 6.5–8.1)

## 2021-05-03 LAB — HEPATITIS PANEL, ACUTE
HCV Ab: NONREACTIVE
Hep A IgM: NONREACTIVE
Hep B C IgM: NONREACTIVE
Hepatitis B Surface Ag: NONREACTIVE

## 2021-05-03 LAB — SARS CORONAVIRUS 2 (TAT 6-24 HRS): SARS Coronavirus 2: NEGATIVE

## 2021-05-03 LAB — PROTIME-INR
INR: 1 (ref 0.8–1.2)
Prothrombin Time: 13.6 seconds (ref 11.4–15.2)

## 2021-05-03 LAB — LIPASE, BLOOD: Lipase: 45 U/L (ref 11–51)

## 2021-05-03 LAB — HIV ANTIBODY (ROUTINE TESTING W REFLEX): HIV Screen 4th Generation wRfx: NONREACTIVE

## 2021-05-03 SURGERY — ESOPHAGOGASTRODUODENOSCOPY (EGD) WITH PROPOFOL
Anesthesia: Moderate Sedation

## 2021-05-03 MED ORDER — MIDAZOLAM HCL (PF) 5 MG/ML IJ SOLN
INTRAMUSCULAR | Status: AC
  Filled 2021-05-03: qty 2

## 2021-05-03 MED ORDER — LACTATED RINGERS IV SOLN
INTRAVENOUS | Status: AC | PRN
  Administered 2021-05-03: 10 mL/h via INTRAVENOUS

## 2021-05-03 MED ORDER — MIDAZOLAM HCL (PF) 5 MG/ML IJ SOLN
INTRAMUSCULAR | Status: DC | PRN
  Administered 2021-05-03: 0.5 mg via INTRAVENOUS
  Administered 2021-05-03: 2 mg via INTRAVENOUS
  Administered 2021-05-03 (×2): 1 mg via INTRAVENOUS

## 2021-05-03 MED ORDER — PANTOPRAZOLE SODIUM 40 MG PO TBEC
40.0000 mg | DELAYED_RELEASE_TABLET | Freq: Every day | ORAL | Status: DC
  Administered 2021-05-03: 40 mg via ORAL
  Filled 2021-05-03: qty 1

## 2021-05-03 MED ORDER — PANTOPRAZOLE SODIUM 40 MG PO TBEC: 40.0000 mg | DELAYED_RELEASE_TABLET | Freq: Every day | ORAL | 1 refills | Status: AC

## 2021-05-03 MED ORDER — FENTANYL CITRATE (PF) 100 MCG/2ML IJ SOLN
INTRAMUSCULAR | Status: AC
  Filled 2021-05-03: qty 4

## 2021-05-03 MED ORDER — FENTANYL CITRATE (PF) 100 MCG/2ML IJ SOLN
INTRAMUSCULAR | Status: DC | PRN
  Administered 2021-05-03: 50 ug via INTRAVENOUS

## 2021-05-03 MED ORDER — FENTANYL CITRATE (PF) 100 MCG/2ML IJ SOLN: 25.0000 ug | INTRAMUSCULAR | Status: DC | PRN

## 2021-05-03 SURGICAL SUPPLY — 15 items

## 2021-05-03 NOTE — Discharge Instructions (Signed)
Gastritis, Adult Gastritis is inflammation of the stomach. There are two kinds of gastritis:  Acute gastritis. This kind develops suddenly.  Chronic gastritis. This kind is much more common and lasts for a long time. Gastritis happens when the lining of the stomach becomes weak or gets damaged. Without treatment, gastritis can lead to stomach bleeding and ulcers. What are the causes? This condition may be caused by:  An infection.  Drinking too much alcohol.  Certain medicines. These include steroids, antibiotics, and some over-the-counter medicines, such as aspirin or ibuprofen.  Having too much acid in the stomach.  A disease of the intestines or stomach.  Stress.  An allergic reaction.  Crohn's disease.  Some cancer treatments (radiation). Sometimes the cause of this condition is not known. What are the signs or symptoms? Symptoms of this condition include:  Pain or a burning sensation in the upper abdomen.  Nausea.  Vomiting.  An uncomfortable feeling of fullness after eating.  Weight loss.  Bad breath.  Blood in your vomit or stools. In some cases, there are no symptoms. How is this diagnosed? This condition may be diagnosed with:  Your medical history and a description of your symptoms.  A physical exam.  Tests. These can include: ? Blood tests. ? Stool tests. ? A test in which a thin, flexible instrument with a light and a camera is passed down the esophagus and into the stomach (upper endoscopy). ? A test in which a sample of tissue is taken for testing (biopsy). How is this treated? This condition may be treated with medicines. The medicines that are used vary depending on the cause of the gastritis:  If the condition is caused by a bacterial infection, you may be given antibiotic medicines.  If the condition is caused by too much acid in the stomach, you may be given medicines called H2 blockers, proton pump inhibitors, or antacids. Treatment  may also involve stopping the use of certain medicines, such as aspirin, ibuprofen, or other NSAIDs. Follow these instructions at home: Medicines  Take over-the-counter and prescription medicines only as told by your health care provider.  If you were prescribed an antibiotic medicine, take it as told by your health care provider. Do not stop taking the antibiotic even if you start to feel better. Eating and drinking  Eat small, frequent meals instead of large meals.  Avoid foods and drinks that make your symptoms worse.  Drink enough fluid to keep your urine pale yellow.   Alcohol use  Do not drink alcohol if: ? Your health care provider tells you not to drink. ? You are pregnant, may be pregnant, or are planning to become pregnant.  If you drink alcohol: ? Limit your use to:  0-1 drink a day for women.  0-2 drinks a day for men. ? Be aware of how much alcohol is in your drink. In the [place] one drink equals one 12 oz bottle of beer (355 mL), one 5 oz glass of wine (148 mL), or one 1 oz glass of hard liquor (44 mL). General instructions  Talk with your health care provider about ways to manage stress, such as getting regular exercise or practicing deep breathing, meditation, or yoga.  Do not use any products that contain nicotine or tobacco, such as cigarettes and e-cigarettes. If you need help quitting, ask your health care provider.  Keep all follow-up visits as told by your health care provider. This is important. Contact a health care provider if:    Your symptoms get worse.  Your symptoms return after treatment. Get help right away if:  You vomit blood or material that looks like coffee grounds.  You have black or dark red stools.  You are unable to keep fluids down.  Your abdominal pain gets worse.  You have a fever.  You do not feel better after one week. Summary  Gastritis is inflammation of the lining of the stomach that can occur suddenly (acute) or  develop slowly over time (chronic).  This condition is diagnosed with a medical history, a physical exam, or tests.  This condition may be treated with medicines to treat infection or medicines to reduce the amount of acid in your stomach.  Follow your health care provider's instructions about taking medicines, making changes to your diet, and knowing when to call for help. This information is not intended to replace advice given to you by your health care provider. Make sure you discuss any questions you have with your health care provider. Document Revised: 04/22/2018 Document Reviewed: 04/22/2018 Elsevier Patient Education  2021 Elsevier Inc.  

## 2021-05-03 NOTE — Op Note (Addendum)
[facility] Patient Name: Susan Frank Procedure Date : 05/03/2021 MRN: [id number] Attending MD: Tressia Danas MD, MD Date of Birth: 04-08-1961 CSN: [id number] Age: 60 Admit Type: Inpatient Procedure:                Upper GI endoscopy Indications:              Epigastric abdominal pain with nausea and vomiting Providers:                Tressia Danas MD, MD, Margaree Mackintosh, RN,                            Clearnce Sorrel, RN, Rosilyn Mings, Technician Referring MD:              Medicines:                Midazolam 4.5 mg IV, Fentanyl 50 micrograms IV Complications:            No immediate complications. Estimated Blood Loss:     Estimated blood loss was minimal. Procedure:                Pre-Anesthesia Assessment:                           - Prior to the procedure, a History and Physical                            was performed, and patient medications and                            allergies were reviewed. The patient's tolerance of                            previous anesthesia was also reviewed. The risks                            and benefits of the procedure and the sedation                            options and risks were discussed with the patient.                            All questions were answered, and informed consent                            was obtained. Prior Anticoagulants: The patient has                            taken no previous anticoagulant or antiplatelet                            agents. ASA Grade Assessment: II - A patient with                            mild systemic disease. After reviewing the risks  and benefits, the patient was deemed in                            satisfactory condition to undergo the procedure.                           After obtaining informed consent, the endoscope was                            passed under direct vision. Throughout the                            procedure,  the patient's blood pressure, pulse, and                            oxygen saturations were monitored continuously. The                            GIF-H190 ([id number]) Olympus gastroscope was                            introduced through the mouth, and advanced to the                            third part of duodenum. The upper GI endoscopy was                            accomplished without difficulty. The patient                            tolerated the procedure well. Scope In: Scope Out: Findings:      The esophagus was normal. No ring, web, stricture, or esophagitis.      Patchy mildly erythematous mucosa without bleeding was found in the       gastric antrum. Biopsies were taken from the antrum, body, and fundus       with a cold forceps for histology. Estimated blood loss was minimal.      Multiple small sessile polyps with no bleeding and no stigmata of recent       bleeding were found in the gastric fundus and in the gastric body. These       have the appearance of fundic gland polyps. Biopsies were taken with a       cold forceps for histology. Estimated blood loss was minimal.      A small hiatal hernia was present.      The examined duodenum was normal. Impression:               - Mild gastritis. Biopsied.                           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 Biopsied.                           -  Examination was otherwise normal.                           - No source of pain identified on this study. Recommendation:           - Return patient to hospital ward for ongoing care.                            Okay for discharge from GI perspective.                           - Low fat diet as tolerated.                           - Continue present medications.                           - Avoid NSAIDs.                           - Await pathology results.                           - Outpatient follow-up with surgeon to consider                             cholecystectomy given the suspected passed stone.                           - Pantoprazole 40 mg QAM x 8 weeks.                           - Outpatient screening colonoscopy recommended. She                            would like to have this performed closer to home.                           The results and recommendations were discussed with                            the patient in the endoscopy recovery area. She was                            given a copy of the procedure note. Procedure Code(s):        --- Professional ---                           [phone], Esophagogastroduodenoscopy, flexible,                            transoral; with biopsy, single or multiple Diagnosis Code(s):        --- Professional ---                           K31.89, Other diseases  of stomach and duodenum                           R10.13, Epigastric pain CPT copyright 2019 American Medical Association. All rights reserved. The codes documented in this report are preliminary and upon coder review may  be revised to meet current compliance requirements. Tressia DanasKimberly Zaelynn Fuchs MD, MD 05/03/2021 3:27:23 PM This report has been signed electronically. Number of Addenda: 0

## 2021-05-03 NOTE — Progress Notes (Signed)
Discharge to home with instructions

## 2021-05-03 NOTE — Progress Notes (Incomplete)
Susan Frank  YTK:[id number] DOB: Jun 11, 1961 DOA: 05/02/2021 PCP: Manon Hilding, MD    Brief Narrative:  60 year old with a history of HTN who presented to the ER with epigastric pain, nausea and vomiting for last 24 hours.  In the ED the patient was afebrile.  Right upper quadrant [place] noted a small gallbladder polyp without any other abnormalities.  Alk phos was 130 with AST 348 and ALT of 260 with a total bilirubin of 2.0.   Consultants:  Gastroenterology  Code Status: FULL CODE  Antimicrobials:  None  DVT prophylaxis: SCDs  Subjective: Afebrile.  Vital signs stable with blood pressure mildly elevated.  Assessment & Plan:  Transaminitis -epigastric pain No stones or biliary ductal dilatation on right upper quadrant ultrasound -lipase normal -MRCP noted no choledocholithiasis or other acute biliary pathology - LFTs improving spontaneously  Recent Labs  Lab 05/02/21 1623 05/03/21 0512  AST 348* 184*  ALT 260* 229*  ALKPHOS 130* 118  BILITOT 2.0* 0.8  PROT 7.4 6.0*  ALBUMIN 4.1 3.4*     HTN Continuing home ACE inhibitor  Depression/anxiety Continuing home Lexapro dose    Family Communication:  Status is: Observation  {Observation:23811}  Dispo: The patient is from: {From:23814}              Anticipated d/c is to: {To:23815}              Patient currently {Medically stable:23817}   Difficult to place patient {Yes/No:25151}         Objective: Blood pressure (!) 142/75, pulse (!) 56, temperature 97.8 F (36.6 C), temperature source Oral, resp. rate 16, SpO2 96 %.  Intake/Output Summary (Last 24 hours) at 05/03/2021 0855 Last data filed at 05/03/2021 0817 Gross per 24 hour  Intake 638.01 ml  Output -  Net 638.01 ml   There were no vitals filed for this visit.  Examination: General: No acute respiratory distress Lungs: Clear to auscultation bilaterally without wheezes or crackles Cardiovascular: Regular rate and rhythm without murmur gallop  or rub normal S1 and S2 Abdomen: Nontender, nondistended, soft, bowel sounds positive, no rebound, no ascites, no appreciable mass Extremities: No significant cyanosis, clubbing, or edema bilateral lower extremities  CBC: Recent Labs  Lab 05/02/21 1122 05/03/21 0512  WBC 10.5 5.5  HGB 13.3 12.3  HCT 41.6 38.5  MCV 87.4 86.7  PLT 293 557   Basic Metabolic Panel: Recent Labs  Lab 05/02/21 1122 05/03/21 0512  NA 138 139  K 3.8 3.5  CL 103 105  CO2 26 26  GLUCOSE 109* 93  BUN 20 14  CREATININE 0.98 0.97  CALCIUM 9.5 9.1   GFR: CrCl cannot be calculated (Unknown ideal weight.).  Liver Function Tests: Recent Labs  Lab 05/02/21 1623 05/03/21 0512  AST 348* 184*  ALT 260* 229*  ALKPHOS 130* 118  BILITOT 2.0* 0.8  PROT 7.4 6.0*  ALBUMIN 4.1 3.4*   Recent Labs  Lab 05/02/21 1623 05/03/21 0512  LIPASE 50 45   No results for input(s): AMMONIA in the last 168 hours.  Coagulation Profile: Recent Labs  Lab 05/03/21 0512  INR 1.0    Cardiac Enzymes: No results for input(s): CKTOTAL, CKMB, CKMBINDEX, TROPONINI in the last 168 hours.  HbA1C: No results found for: HGBA1C  CBG: No results for input(s): GLUCAP in the last 168 hours.  Recent Results (from the past 240 hour(s))  SARS CORONAVIRUS 2 (TAT 6-24 HRS) Nasopharyngeal Nasopharyngeal Swab     Status: None  Collection Time: 05/02/21  8:55 PM   Specimen: Nasopharyngeal Swab  Result Value Ref Range Status   SARS Coronavirus 2 NEGATIVE NEGATIVE Final    Comment: (NOTE) SARS-CoV-2 target nucleic acids are NOT DETECTED.  The SARS-CoV-2 RNA is generally detectable in upper and lower respiratory specimens during the acute phase of infection. Negative results do not preclude SARS-CoV-2 infection, do not rule out co-infections with other pathogens, and should not be used as the sole basis for treatment or other patient management decisions. Negative results must be combined with clinical  observations, patient history, and epidemiological information. The expected result is Negative.  Fact Sheet for Patients: SugarRoll.be  Fact Sheet for Healthcare Providers: https://www.woods-mathews.com/  This test is not yet approved or cleared by the [place] FDA and  has been authorized for detection and/or diagnosis of SARS-CoV-2 by FDA under an Emergency Use Authorization (EUA). This EUA will remain  in effect (meaning this test can be used) for the duration of the COVID-19 declaration under Se ction 564(b)(1) of the Act, 21 U.S.C. section 360bbb-3(b)(1), unless the authorization is terminated or revoked sooner.  Performed at [facility], [place] [address] [place], [place] [address]      Scheduled Meds: . escitalopram  10 mg Oral QHS  . lisinopril  10 mg Oral Daily   Continuous Infusions: . sodium chloride 90 mL/hr at 05/03/21 0434     LOS: 0 days   Cherene Altes, MD [facility] Office  [phone] Pager - Text Page per Shea Evans  If 7PM-7AM, please contact night-coverage per Amion 05/03/2021, 8:55 AM

## 2021-05-03 NOTE — Discharge Summary (Signed)
DISCHARGE SUMMARY  Susan Frank  MR#: [id number]  DOB:[birth year]  Date of Admission: 05/02/2021 Date of Discharge: 05/03/2021  Attending Physician:Jullian Previti Hennie Duos, MD  Patient's GBE:EFEOFH, Silvestre Moment, MD  Consults: [facility]  Disposition: D/C Home    Follow-up Appts:  Follow-up Information    Sasser, Silvestre Moment, MD Follow up in 2 week(s).   Specialty: Family Medicine Why: Ask your PCP about a General Surgery referral for further outpatient evaluation for a suspected passed gallstone.  Contact information: [place] [address] [phone]               Tests Needing Follow-up: -Recheck LFTs  Discharge Diagnoses: Transaminitis - unclear etiology - suspect passed gallstone Epigastric/abdominal pain - acute gastritis  HTN Depression/anxiety  Initial presentation: 60 year old with a history of HTN who presented to the ER with epigastric pain, nausea and vomiting for 24 hours.  In the ED the patient was afebrile.  Right upper quadrant [place] noted a small gallbladder polyp without any other abnormalities.  Alk phos was 130 with AST 348 and ALT of 260 with a total bilirubin of 2.0.  Hospital Course: The patient was admitted to the acute units.  Right upper quadrant ultrasound, as noted above listed, was accomplished and revealed no evidence of choledocholithiasis or biliary ductal dilatation.  Lipase was noted to be normal.  MRCP was carried out and revealed no evidence of choledocholithiasis or any other acute biliary pathology.  The patient's LFTs were trended and spontaneously improved over the course of her hospital stay.  The patient's symptoms resolved during her hospital stay.  She was able to tolerate regular oral intake.  She did have an EGD performed by [facility] 5/18 with no acute findings that would explain her symptoms, noting only mild gastritis.  The working diagnosis was that of a passed small gallstone.  Outpatient surgical consultation should be  considered as per GI.  No other active medical issues were encountered during this stay and therefore the patient was able to be discharged home following her EGD 5/18.  Allergies as of 05/03/2021      Reactions   Asa [aspirin]    Shortness ob breath    Zoloft [sertraline Hcl]    angioedema    Fish Allergy Rash   Niacin And Related Palpitations   Omega 3 [fish Oil] Nausea And Vomiting, Rash      Medication List    STOP taking these medications   ibuprofen 800 MG tablet Commonly known as: ADVIL     TAKE these medications   escitalopram 10 MG tablet Commonly known as: LEXAPRO Take 10 mg by mouth at bedtime.   lisinopril-hydrochlorothiazide 20-12.5 MG tablet Commonly known as: ZESTORETIC Take 1 tablet by mouth daily.   montelukast 10 MG tablet Commonly known as: SINGULAIR Take 10 mg by mouth daily as needed (For allergy).   pantoprazole 40 MG tablet Commonly known as: PROTONIX Take 1 tablet (40 mg total) by mouth daily at 6 (six) AM. Start taking on: May 04, 2021       Day of Discharge BP 134/61   Pulse (!) 58   Temp (!) 97.5 F (36.4 C) (Temporal)   Resp 18   SpO2 96%   Physical Exam: General: No acute respiratory distress Lungs: Clear to auscultation bilaterally without wheezes or crackles Cardiovascular: Regular rate and rhythm without murmur gallop or rub normal S1 and S2 Abdomen: Nontender, nondistended, soft, bowel sounds positive, no rebound, no ascites, no appreciable  mass Extremities: No significant cyanosis, clubbing, or edema bilateral lower extremities  Basic Metabolic Panel: Recent Labs  Lab 05/02/21 1122 05/03/21 0512  NA 138 139  K 3.8 3.5  CL 103 105  CO2 26 26  GLUCOSE 109* 93  BUN 20 14  CREATININE 0.98 0.97  CALCIUM 9.5 9.1    Liver Function Tests: Recent Labs  Lab 05/02/21 1623 05/03/21 0512  AST 348* 184*  ALT 260* 229*  ALKPHOS 130* 118  BILITOT 2.0* 0.8  PROT 7.4 6.0*  ALBUMIN 4.1 3.4*   Recent Labs  Lab  05/02/21 1623 05/03/21 0512  LIPASE 50 45    Coags: Recent Labs  Lab 05/03/21 0512  INR 1.0    CBC: Recent Labs  Lab 05/02/21 1122 05/03/21 0512  WBC 10.5 5.5  HGB 13.3 12.3  HCT 41.6 38.5  MCV 87.4 86.7  PLT 293 258     Recent Results (from the past 240 hour(s))  SARS CORONAVIRUS 2 (TAT 6-24 HRS) Nasopharyngeal Nasopharyngeal Swab     Status: None   Collection Time: 05/02/21  8:55 PM   Specimen: Nasopharyngeal Swab  Result Value Ref Range Status   SARS Coronavirus 2 NEGATIVE NEGATIVE Final    Comment: (NOTE) SARS-CoV-2 target nucleic acids are NOT DETECTED.  The SARS-CoV-2 RNA is generally detectable in upper and lower respiratory specimens during the acute phase of infection. Negative results do not preclude SARS-CoV-2 infection, do not rule out co-infections with other pathogens, and should not be used as the sole basis for treatment or other patient management decisions. Negative results must be combined with clinical observations, patient history, and epidemiological information. The expected result is Negative.  Fact Sheet for Patients: SugarRoll.be  Fact Sheet for Healthcare Providers: https://www.woods-mathews.com/  This test is not yet approved or cleared by the [place] FDA and  has been authorized for detection and/or diagnosis of SARS-CoV-2 by FDA under an Emergency Use Authorization (EUA). This EUA will remain  in effect (meaning this test can be used) for the duration of the COVID-19 declaration under Se ction 564(b)(1) of the Act, 21 U.S.C. section 360bbb-3(b)(1), unless the authorization is terminated or revoked sooner.  Performed at [facility], [place] [address], [place], [place] [address]       Time spent in discharge (includes decision making & examination of pt): 35 minutes  05/03/2021, 3:41 PM   Cherene Altes, MD [facility] Office  [phone]

## 2021-05-03 NOTE — Consult Note (Addendum)
[facility] Consult: 9:17 AM 05/03/2021  LOS: 0 days    Referring Provider: Dr Donette LarryMc Clung  Primary Care Physician:  Estanislado PandySasser, Paul W, [facility] Primary [facility]astroenterologist: Gentry FitzUnassigned patient.    Reason for Consultation:  Abd pain and elevated LFTs.     HPI: Mat CarneRhonda H Manternach is a 60 y.o. female.  [place]ives in [place]. Previous EGD or colonoscopy.  Medical issues include hypertension and situational anxiety.  Patient's been dealing with a lot on her plate.  She works a full-time job as a Diplomatic Services operational officersecretary and works 3 or 4 hours after that taking care of her ailing parents.  Her father has had a stroke and has left hemiparesis, her mother has early dementia.  Her brother, who lives with the parents is a diabetic who has had leg amputation and throat cancer.  PCP started her on Lexapro about 4 months ago.  A month ago she had routine lab work and PCP visit which was fine. Occasionally will take Pepto-Bismol for nausea, indigestion and it effectively controls his symptoms.  She might use it a couple of days in a row and then not for a few months.  P.o. intake has become erratic due to her work schedule but weight is stable.   At 5:30 AM yesterday developed epigastric pain, vomited nonbloody material and felt better.  The cycle repeated itself during the morning and by the third episode of vomiting [place] [facility].  Symptoms have improved overnight and she is no longer in pain and has not vomited.  Had a small brown stool yesterday  T bili 2 >> 0.8.  Alkaline phosphatase 130 >> 118.  AST/ALT 348/260 >> 184/229.  Lipase 50 >> 45.   CBC WNL. Abdominal ultrasound: 6 millimeter gallbladder polyp.  3 mm CBD.  Normal PV flow.  Otherwise unremarkable study. MRCP: Small 4 to 5 mm stone vs polyp  independent portion of gallbladder.  No evidence of cholecystitis.  Liver, biliary tree, pancreas, pancreatic duct unremarkable.  No vascular abnormalities.  Patient does not drink alcoholic beverages or smoke.  Pertinent family history noted above but her dad is status postcholecystectomy.    Past Medical History:  Diagnosis Date  . Essential hypertension     Past Surgical History:  Procedure Laterality Date  . EYE SURGERY      Prior to Admission medications   Medication Sig Start Date End Date Taking? Authorizing Provider  escitalopram (LEXAPRO) 10 MG tablet Take 10 mg by mouth at bedtime.   Yes [provider]  lisinopril-hydrochlorothiazide (ZESTORETIC) 20-12.5 MG tablet Take 1 tablet by mouth daily.   Yes [provider]  montelukast (SINGULAIR) 10 MG tablet Take 10 mg by mouth daily as needed (For allergy).   Yes [provider]  ibuprofen (ADVIL,MOTRIN) 800 MG tablet Take 800 mg by mouth every 8 (eight) hours as needed for moderate pain.    [provider]    Scheduled Meds: . escitalopram  10 mg Oral QHS  . lisinopril  10 mg Oral  Daily   Infusions: . sodium chloride 90 mL/hr at 05/03/21 0434   PRN Meds: fentaNYL (SUBLIMAZE) injection, montelukast, ondansetron **OR** ondansetron (ZOFRAN) IV   Allergies as of 05/02/2021 - Review Complete 05/02/2021  Allergen Reaction Noted  . Asa [aspirin]  12/19/2017  . Zoloft [sertraline hcl]  01/07/2018  . Fish allergy Rash 12/19/2017  . Niacin and related Palpitations 12/19/2017  . Omega 3 [fish oil] Nausea And Vomiting and Rash 12/19/2017    Family History  Problem Relation Age of Onset  . Diabetes Father   . Stroke Father   . Heart disease Father   . Heart disease Mother   . Diabetes Brother   . Heart disease Brother   . Diabetes Brother     Social History   Socioeconomic History  . Marital status: Married    Spouse name: Not on file  . Number of children: Not on file  .  Years of education: Not on file  . Highest education level: Not on file  Occupational History  . Not on file  Tobacco Use  . Smoking status: Former Games developer  . Smokeless tobacco: Never Used  Substance and Sexual Activity  . Alcohol use: Yes    Comment: Occasionally  . Drug use: No  . Sexual activity: Not on file  Other Topics Concern  . Not on file  Social History Narrative  . Not on file   Social Determinants of Health   Financial Resource Strain: Not on file  Food Insecurity: Not on file  Transportation Needs: Not on file  Physical Activity: Not on file  Stress: Not on file  Social Connections: Not on file  Intimate Partner Violence: Not on file    REVIEW OF SYSTEMS: Constitutional: Generally feels like she is running ragged but no extreme exhaustion or fatigue. ENT:  No nose bleeds Pulm: No shortness of breath.  No cough. CV:  No palpitations, no LE edema.  No angina GU:  No hematuria, no frequency GI: See HPI Heme: Bruises easily but not excessively.  No unusual bleeding. Transfusions: None. Neuro:  No headaches, no peripheral tingling or numbness.  No syncope, no seizures.   Derm:  No itching, no rash or sores.  Endocrine:  No sweats or chills.  No polyuria or dysuria Immunization: Vaccinated for COVID-19. Travel:  None beyond local counties in last few months.    PHYSICAL EXAM: Vital signs in last 24 hours: Vitals:   05/03/21 0600 05/03/21 0819  BP: (!) 150/74 (!) 142/75  Pulse: (!) 54 (!) 56  Resp: 18 16  Temp: 98.1 F (36.7 C) 97.8 F (36.6 C)  SpO2: 97% 96%   Wt Readings from Last 3 Encounters:  01/08/18 89.4 kg  12/24/17 89.4 kg  12/19/17 88 kg    General: Pleasant, non-ill-appearing, comfortable Head: No facial asymmetry or swelling.  No signs of head trauma. Eyes: No scleral icterus.  No conjunctival pallor.  EOMI Ears: No hearing deficit Nose: No congestion or discharge. Mouth: Good dentition.  Tongue midline.  Mucosa moist, pink,  clear. Neck: No JVD, no masses, no thyromegaly Lungs: Clear bilaterally without labored breathing. Heart: RRR.  No MRG.  S1, S2 present Abdomen: Soft without tenderness.  No distention.   Rectal: deferred   Musc/Skeltl: No joint redness, swelling or gross deformities. Extremities: No CCE. Neurologic: Oriented x3.  Good historian.  Moves all 4 limbs without tremor or weakness. Skin: No jaundice, no rash, no sores. Nodes: No cervical adenopathy Psych: Does not, cooperative, calm.  Mood speech.  Intake/Output from previous day: 05/17 0701 - 05/18 0700 In: 428.1 [I.V.:428.1] Out: -  Intake/Output this shift: Total I/O In: 209.9 [I.V.:209.9] Out: -   LAB RESULTS: Recent Labs    05/02/21 1122 05/03/21 0512  WBC 10.5 5.5  HGB 13.3 12.3  HCT 41.6 38.5  PLT 293 258   BMET Lab Results  Component Value Date   NA 139 05/03/2021   NA 138 05/02/2021   NA 142 12/19/2017   K 3.5 05/03/2021   K 3.8 05/02/2021   K 4.0 12/19/2017   CL 105 05/03/2021   CL 103 05/02/2021   CL 107 12/19/2017   CO2 26 05/03/2021   CO2 26 05/02/2021   CO2 26 12/19/2017   GLUCOSE 93 05/03/2021   GLUCOSE 109 (H) 05/02/2021   GLUCOSE 106 (H) 12/19/2017   BUN 14 05/03/2021   BUN 20 05/02/2021   BUN 10 12/19/2017   CREATININE 0.97 05/03/2021   CREATININE 0.98 05/02/2021   CREATININE 0.85 12/19/2017   CALCIUM 9.1 05/03/2021   CALCIUM 9.5 05/02/2021   CALCIUM 9.5 12/19/2017   LFT Recent Labs    05/02/21 1623 05/03/21 0512  PROT 7.4 6.0*  ALBUMIN 4.1 3.4*  AST 348* 184*  ALT 260* 229*  ALKPHOS 130* 118  BILITOT 2.0* 0.8  BILIDIR 0.9*  --   IBILI 1.1*  --    PT/INR Lab Results  Component Value Date   INR 1.0 05/03/2021   Hepatitis Panel Recent Labs    05/02/21 2108  HEPBSAG NON REACTIVE  HCVAB NON REACTIVE  HEPAIGM NON REACTIVE  HEPBIGM NON REACTIVE   C-Diff No components found for: CDIFF Lipase     Component Value Date/Time   LIPASE 45 05/03/2021 0512    Drugs of Abuse   No results found for: LABOPIA, COCAINSCRNUR, LABBENZ, AMPHETMU, THCU, LABBARB   RADIOLOGY STUDIES: DG Chest 2 View  Result Date: 05/02/2021 CLINICAL DATA:  Chest pain. EXAM: CHEST - 2 VIEW COMPARISON:  Chest x-ray dated December 19, 2017. FINDINGS: The heart size and mediastinal contours are within normal limits. Both lungs are clear. The visualized skeletal structures are unremarkable. IMPRESSION: No active cardiopulmonary disease. Electronically Signed   By: Obie Dredge M.D.   On: 05/02/2021 12:23   MR ABDOMEN MRCP WO CONTRAST  Result Date: 05/03/2021 CLINICAL DATA:  Emergency department presentation for epigastric pain, nausea and vomiting with unremarkable ultrasound evaluation of the RIGHT upper quadrant. EXAM: MRI ABDOMEN WITHOUT CONTRAST  (INCLUDING MRCP) TECHNIQUE: Multiplanar multisequence MR imaging of the abdomen was performed. Heavily T2-weighted images of the biliary and pancreatic ducts were obtained, and three-dimensional MRCP images were rendered by post processing. COMPARISON:  May 02, 2021 FINDINGS: Lower chest: Incidental imaging of the lung bases without effusion or consolidation. Limited assessment on MRI. Hepatobiliary: Liver with smooth contours. No significant fat or iron deposition in the liver. Biliary tree is nondilated. Small stone or polyp in the dependent portion of the gallbladder measuring approximately 4-5 mm. No pericholecystic stranding. No focal lesion in the liver on noncontrast imaging. Pancreas: Normal intrinsic T1 signal. No ductal dilation or sign of inflammation. No focal lesion. No signs of pancreatic divisum. Spleen:  Normal spleen. Adrenals/Urinary Tract: Adrenal glands are normal. Kidneys with smooth contours, no hydronephrosis or perinephric stranding. Stomach/Bowel: Gastrointestinal tract unremarkable to the extent evaluated on noncontrast abdominal MRI. Vascular/Lymphatic: Normal caliber of abdominal vessels. There is no gastrohepatic or hepatoduodenal  ligament lymphadenopathy. No retroperitoneal or mesenteric lymphadenopathy. Other:  No ascites. Musculoskeletal:  No suspicious bone lesions identified. IMPRESSION: 1. No signs of choledocholithiasis. 2. No acute biliary pathology. 3. Otherwise unremarkable MRI of the abdomen. Electronically Signed   By: Donzetta Kohut M.D.   On: 05/03/2021 08:22   US Abdomen Limited RUQ (LIVER/GB)  Result Date: 05/02/2021 CLINICAL DATA:  Epigastric pain EXAM: ULTRASOUND ABDOMEN LIMITED RIGHT UPPER QUADRANT COMPARISON:  None. FINDINGS: Gallbladder: Gallbladder is well distended. No wall thickening or pericholecystic fluid is noted. 6 mm polyp is seen. No cholelithiasis is noted. Common bile duct: Diameter: 3 mm Liver: No focal lesion identified. Within normal limits in parenchymal echogenicity. Portal vein is patent on color Doppler imaging with normal direction of blood flow towards the liver. Other: None. IMPRESSION: Small gallbladder polyp measuring 6 mm. No other focal abnormality is noted. Electronically Signed   By: Alcide Clever M.D.   On: 05/02/2021 18:55     IMPRESSION:   *    Abd pain relieved by vomiting, elevated LFTs.  Only finding on ultrasound and MRCP is small gallbladder polyp versus stone. Symptoms acute in onset and resolved overnight, within less than 24 hours. LFTs improved. Symptoms seem most consistent with ulcer disease, gastritis. ?microlithiasis passing through CBD??    PLAN:     *    EGD set up for 2 PM today with moderate sedation.   Jennye Moccasin  05/03/2021, 9:17 AM Phone [phone]

## 2021-05-05 ENCOUNTER — Encounter (HOSPITAL_COMMUNITY): Payer: Self-pay | Admitting: Gastroenterology

## 2021-05-05 LAB — SURGICAL PATHOLOGY

## 2021-05-12 ENCOUNTER — Encounter: Payer: Self-pay | Admitting: Gastroenterology

## 2023-04-14 IMAGING — DX DG CHEST 2V
2 series · 2 of 2 positions shown · non-contrast
Comparison: Chest x-ray dated December 19, 2017.

CLINICAL DATA: Chest pain.

EXAM:
CHEST - 2 VIEW

[chest pa]
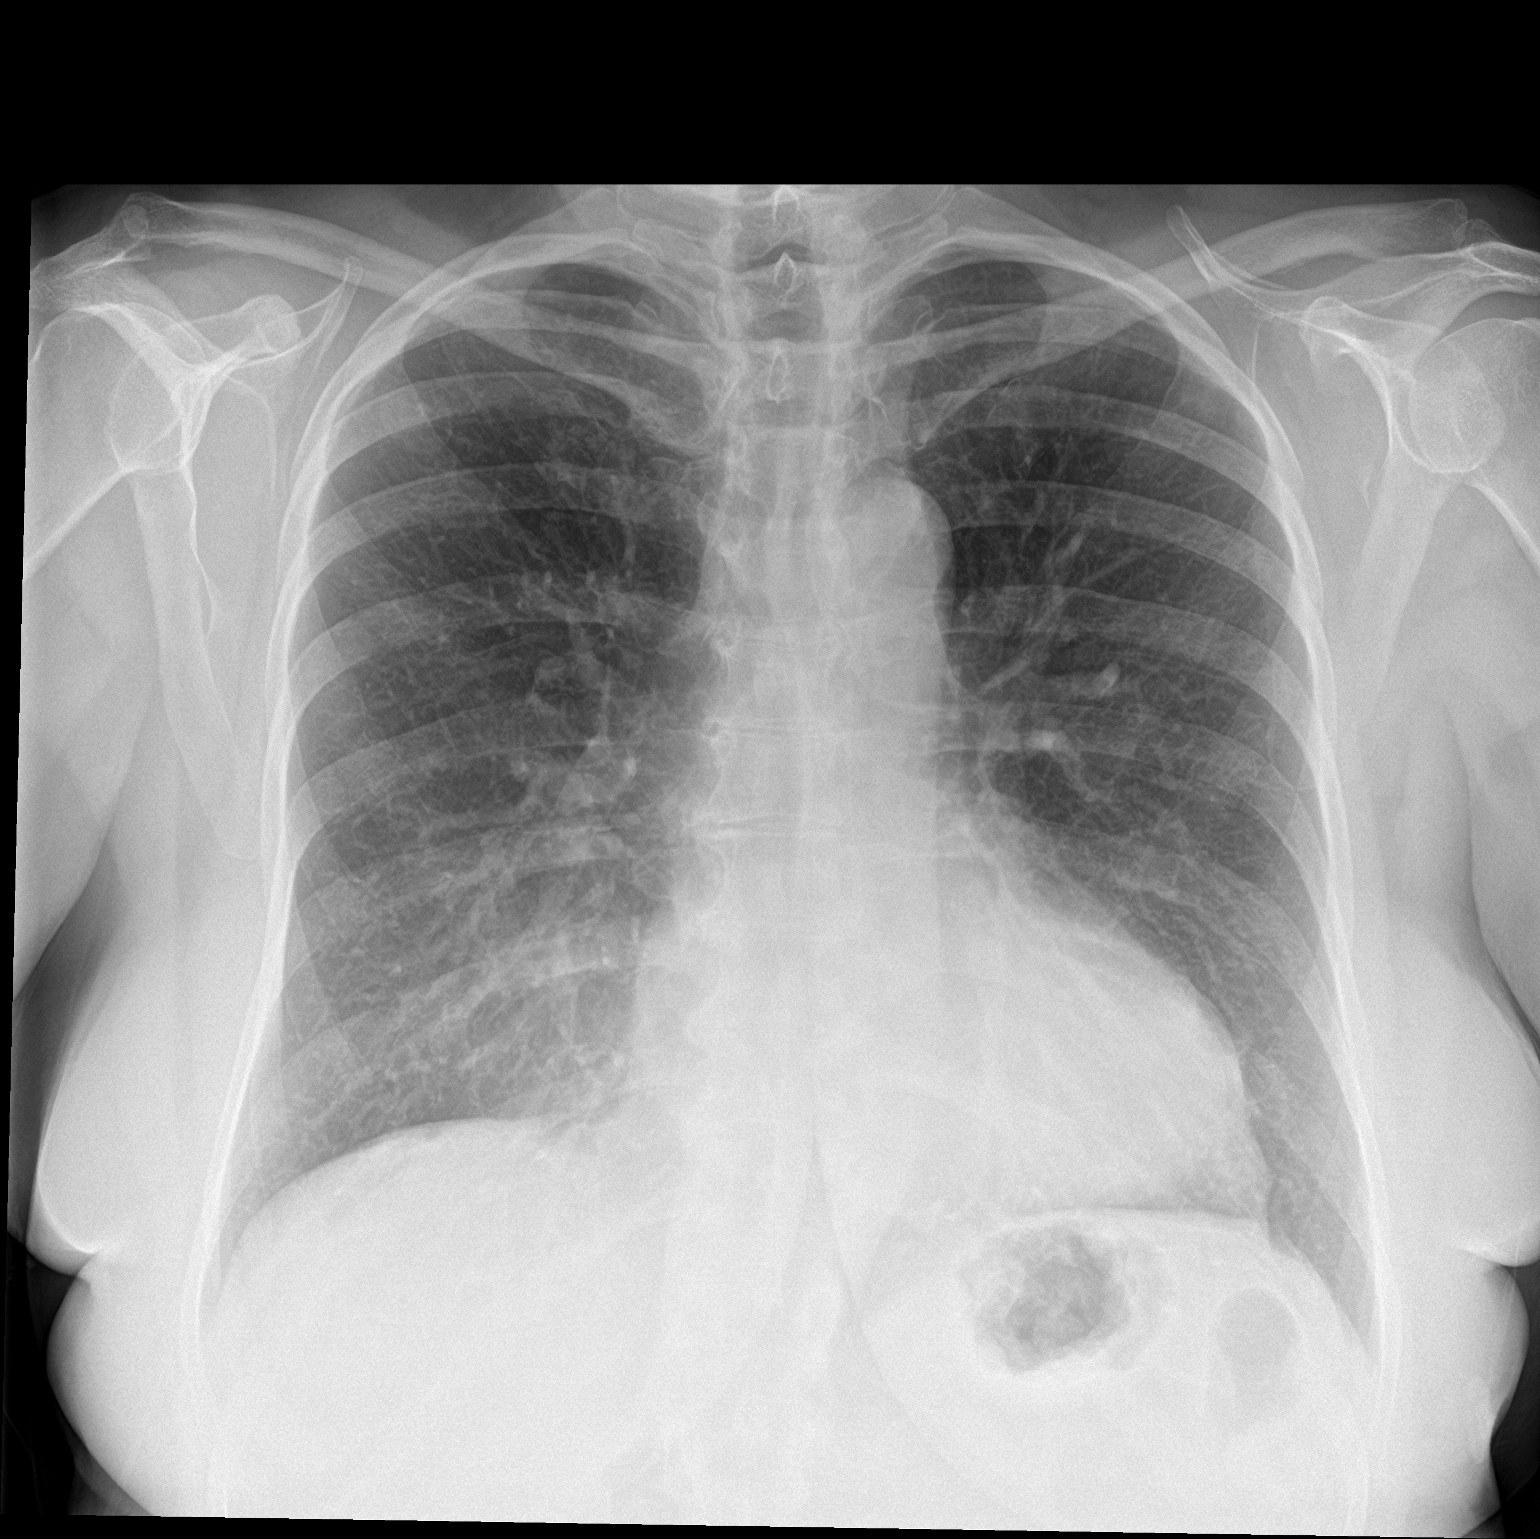

[chest lat]
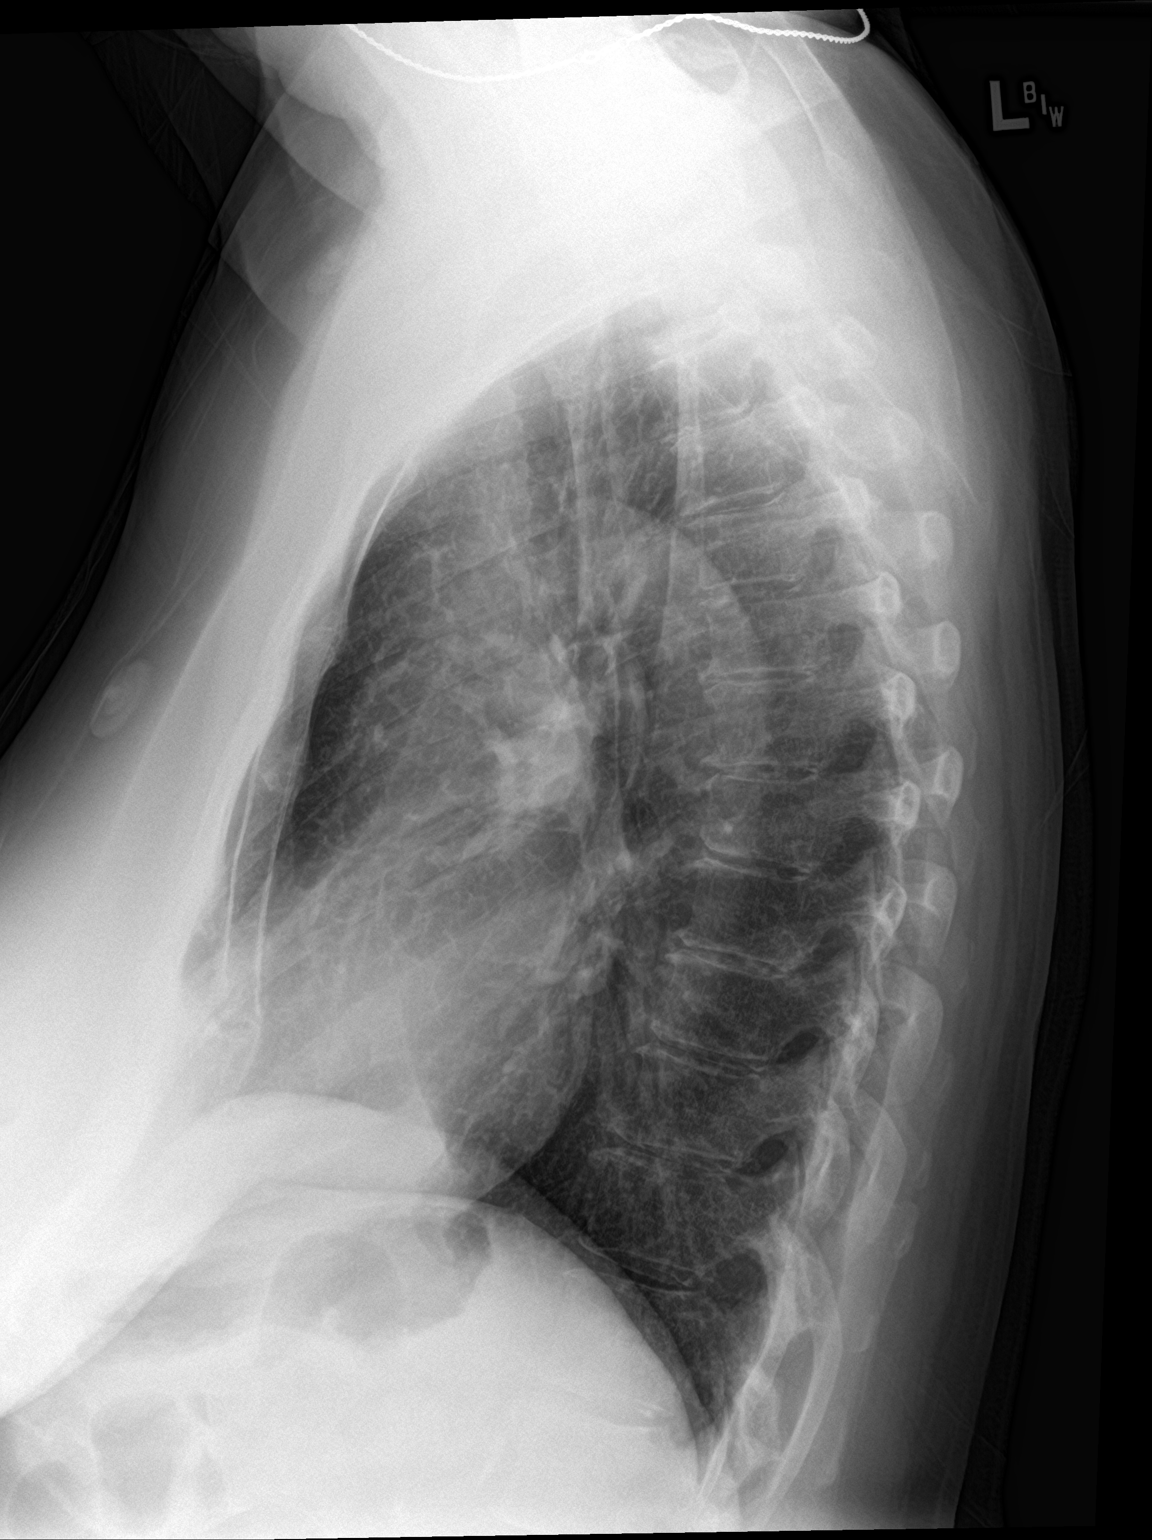

[2 of 2 positions shown; findings below may reference images not displayed]

FINDINGS: The heart size and mediastinal contours are within normal limits.
Both lungs are clear. The visualized skeletal structures are
unremarkable.
IMPRESSION: No active cardiopulmonary disease.
